# Patient Record
Sex: Female | Born: 1965 | Race: White | Hispanic: No | Marital: Married | Smoking: Never smoker
Health system: Southern US, Community
[De-identification: ages and names within clinical notes are randomized; demographics above are authoritative.]

---

## 2017-04-20 ENCOUNTER — Other Ambulatory Visit (HOSPITAL_BASED_OUTPATIENT_CLINIC_OR_DEPARTMENT_OTHER): Payer: Self-pay | Admitting: Physician Assistant

## 2017-04-20 DIAGNOSIS — Z1231 Encounter for screening mammogram for malignant neoplasm of breast: Secondary | ICD-10-CM

## 2017-06-22 ENCOUNTER — Other Ambulatory Visit (HOSPITAL_BASED_OUTPATIENT_CLINIC_OR_DEPARTMENT_OTHER): Payer: Self-pay | Admitting: Physician Assistant

## 2017-06-22 ENCOUNTER — Encounter (HOSPITAL_BASED_OUTPATIENT_CLINIC_OR_DEPARTMENT_OTHER): Payer: Self-pay

## 2017-06-22 ENCOUNTER — Ambulatory Visit (HOSPITAL_BASED_OUTPATIENT_CLINIC_OR_DEPARTMENT_OTHER)
Admission: RE | Admit: 2017-06-22 | Discharge: 2017-06-22 | Disposition: A | Discharge status: Home / Self Care | Payer: Managed Care, Other (non HMO) | Source: Ambulatory Visit | Attending: Physician Assistant | Admitting: Physician Assistant

## 2017-06-22 DIAGNOSIS — Z1231 Encounter for screening mammogram for malignant neoplasm of breast: Secondary | ICD-10-CM

## 2017-07-20 ENCOUNTER — Other Ambulatory Visit: Payer: Self-pay | Admitting: Family Medicine

## 2017-07-20 DIAGNOSIS — M25462 Effusion, left knee: Secondary | ICD-10-CM

## 2017-07-21 ENCOUNTER — Ambulatory Visit (HOSPITAL_BASED_OUTPATIENT_CLINIC_OR_DEPARTMENT_OTHER)
Admission: RE | Admit: 2017-07-21 | Discharge: 2017-07-21 | Disposition: A | Discharge status: Home / Self Care | Payer: Managed Care, Other (non HMO) | Source: Ambulatory Visit | Attending: Family Medicine | Admitting: Family Medicine

## 2017-07-21 DIAGNOSIS — M25462 Effusion, left knee: Secondary | ICD-10-CM

## 2017-07-21 DIAGNOSIS — M7989 Other specified soft tissue disorders: Secondary | ICD-10-CM | POA: Insufficient documentation

## 2017-07-21 DIAGNOSIS — M1712 Unilateral primary osteoarthritis, left knee: Secondary | ICD-10-CM | POA: Insufficient documentation

## 2018-02-27 ENCOUNTER — Emergency Department (HOSPITAL_COMMUNITY)
Admission: EM | Admit: 2018-02-27 | Discharge: 2018-02-27 | Disposition: A | Discharge status: Home / Self Care | Payer: BC Managed Care – PPO | Attending: Emergency Medicine | Admitting: Emergency Medicine

## 2018-02-27 ENCOUNTER — Other Ambulatory Visit: Payer: Self-pay

## 2018-02-27 DIAGNOSIS — M25551 Pain in right hip: Secondary | ICD-10-CM | POA: Diagnosis not present

## 2018-02-27 MED ORDER — KETOROLAC TROMETHAMINE 60 MG/2ML IM SOLN
60.0000 mg | Freq: Once | INTRAMUSCULAR | Status: AC
  Administered 2018-02-27: 60 mg via INTRAMUSCULAR
  Filled 2018-02-27: qty 2

## 2018-02-27 MED ORDER — DICLOFENAC SODIUM 75 MG PO TBEC: 75.0000 mg | DELAYED_RELEASE_TABLET | Freq: Two times a day (BID) | ORAL | 0 refills | Status: AC

## 2018-02-27 MED ORDER — METHYLPREDNISOLONE SODIUM SUCC 125 MG IJ SOLR
125.0000 mg | Freq: Once | INTRAMUSCULAR | Status: AC
  Administered 2018-02-27: 125 mg via INTRAMUSCULAR
  Filled 2018-02-27: qty 2

## 2018-02-27 MED ORDER — HYDROCODONE-ACETAMINOPHEN 5-325 MG PO TABS: 2.0000 | ORAL_TABLET | ORAL | 0 refills | Status: AC | PRN

## 2018-02-27 MED ORDER — METHOCARBAMOL 500 MG PO TABS: 500.0000 mg | ORAL_TABLET | Freq: Two times a day (BID) | ORAL | 0 refills | Status: AC

## 2018-02-27 NOTE — ED Provider Notes (Signed)
[facility] EMERGENCY DEPARTMENT Provider Note   CSN: [id number] Arrival date & time: 02/27/18  1746     History   Chief Complaint Chief Complaint  Patient presents with  . Hip Pain    HPI Caitlin Rollins is a 51 y.o. female.  The history is provided by the patient. No language interpreter was used.  Hip Pain  This is a new problem. The problem occurs constantly. The problem has been gradually worsening. Pertinent negatives include no chest pain and no abdominal pain. Nothing aggravates the symptoms. Nothing relieves the symptoms. She has tried nothing for the symptoms. The treatment provided no relief.  Pt complains of pain in her right hip.  Pt was seen at after hours ortho clinic.  Pt was given solumedrol and torodol with some relief.  Pt reports she has not been able to use crutches due to her job.  Pt reports she is suppose to follow up with Dr. Farris Has and can not due to her job.  No past medical history on file.  There are no active problems to display for this patient.      OB History   None      Home Medications    Prior to Admission medications   Medication Sig Start Date End Date Taking? Authorizing Provider  diclofenac (VOLTAREN) 75 MG EC tablet Take 1 tablet (75 mg total) by mouth 2 (two) times daily. 02/27/18   Elson Areas, PA-C  HYDROcodone-acetaminophen (NORCO/VICODIN) 5-325 MG tablet Take 2 tablets by mouth every 4 (four) hours as needed. 02/27/18   Elson Areas, PA-C  methocarbamol (ROBAXIN) 500 MG tablet Take 1 tablet (500 mg total) by mouth 2 (two) times daily. 02/27/18   Elson Areas, PA-C    Family History No family history on file.  Social History Social History   Tobacco Use  . Smoking status: Not on file  Substance Use Topics  . Alcohol use: Not on file  . Drug use: Not on file     Allergies   Patient has no known allergies.   Review of Systems Review of Systems  Cardiovascular: Negative for chest  pain.  Gastrointestinal: Negative for abdominal pain.  All other systems reviewed and are negative.    Physical Exam Updated Vital Signs BP (!) 163/84   Pulse 70   Temp 99.3 F (37.4 C) (Oral)   Resp 16   SpO2 100%   Physical Exam  Constitutional: She appears well-developed and well-nourished.  HENT:  Head: Normocephalic.  Cardiovascular: Normal rate.  Pulmonary/Chest: Effort normal.  Musculoskeletal: She exhibits tenderness.  Tender right hip,  Pain with movement  Neurological: She is alert.  Skin: Skin is warm.  Psychiatric: She has a normal mood and affect.  Nursing note and vitals reviewed.    ED Treatments / Results  Labs (all labs ordered are listed, but only abnormal results are displayed) Labs Reviewed - No data to display  EKG None  Radiology No results found.  Procedures Procedures (including critical care time)  Medications Ordered in ED Medications  ketorolac (TORADOL) injection 60 mg (60 mg Intramuscular Given 02/27/18 1925)  methylPREDNISolone sodium succinate (SOLU-MEDROL) 125 mg/2 mL injection 125 mg (125 mg Intramuscular Given 02/27/18 1925)     Initial Impression / Assessment and Plan / ED Course  I have reviewed the triage vital signs and the nursing notes.  Pertinent labs & imaging results that were available during my care of the patient were reviewed by me  and considered in my medical decision making (see chart for details).     MDM  I can not see xray but pt was told she has arthritis. Pt has not had a fall, I doubt fracture.  Pt is requesting higher dose of prednisone because she does not think it is working.   Pt given torodol and solumedrol.  Pt advised to hold prednisone pills today.  Pt given rx for voltaren, robaxin and hydrocodone.  Pt advised to use crutches, ice/heat.  Pt is advised she will need to see Orthopaedist in follow up.  Pt advised she may need further imaging.   Final Clinical Impressions(s) / ED Diagnoses    Final diagnoses:  Right hip pain    ED Discharge Orders         Ordered    diclofenac (VOLTAREN) 75 MG EC tablet  2 times daily     02/27/18 1901    methocarbamol (ROBAXIN) 500 MG tablet  2 times daily     02/27/18 1901    HYDROcodone-acetaminophen (NORCO/VICODIN) 5-325 MG tablet  Every 4 hours PRN     02/27/18 1921        An After Visit Summary was printed and given to the patient.   Elson AreasSofia, Isadore Palecek K, PA-C 02/27/18 2222    Melene PlanFloyd, Dan, DO 03/02/18 [phone]

## 2018-02-27 NOTE — Discharge Instructions (Signed)
Use crutches for the next 4 days.  Follow up with Orthopaedist as directed.

## 2018-02-27 NOTE — ED Triage Notes (Signed)
Pt here with right sided hip pain. Pt was recently seen at orthopedist and given crutches and prednisone 10mg  with follow up to see Dr. Farris HasKramer but is unable to see him due to availability. Pt also endorses taking IB profen which helps, but she has taken so much that now her stomach is hurting some, so she has stopped taking that.

## 2018-09-26 ENCOUNTER — Other Ambulatory Visit: Payer: Self-pay | Admitting: Nurse Practitioner

## 2018-09-26 DIAGNOSIS — N6320 Unspecified lump in the left breast, unspecified quadrant: Secondary | ICD-10-CM

## 2018-09-26 DIAGNOSIS — N6310 Unspecified lump in the right breast, unspecified quadrant: Secondary | ICD-10-CM

## 2018-10-09 ENCOUNTER — Ambulatory Visit
Admission: RE | Admit: 2018-10-09 | Discharge: 2018-10-09 | Disposition: A | Discharge status: Home / Self Care | Payer: BC Managed Care – PPO | Source: Ambulatory Visit | Attending: Nurse Practitioner | Admitting: Nurse Practitioner

## 2018-10-09 ENCOUNTER — Ambulatory Visit: Payer: BLUE CROSS/BLUE SHIELD

## 2018-10-09 ENCOUNTER — Ambulatory Visit
Admission: RE | Admit: 2018-10-09 | Discharge: 2018-10-09 | Disposition: A | Discharge status: Home / Self Care | Payer: BLUE CROSS/BLUE SHIELD | Source: Ambulatory Visit | Attending: Nurse Practitioner | Admitting: Nurse Practitioner

## 2018-10-09 ENCOUNTER — Other Ambulatory Visit: Payer: Self-pay

## 2018-10-09 DIAGNOSIS — N6310 Unspecified lump in the right breast, unspecified quadrant: Secondary | ICD-10-CM

## 2018-10-09 DIAGNOSIS — N6320 Unspecified lump in the left breast, unspecified quadrant: Secondary | ICD-10-CM

## 2018-11-28 ENCOUNTER — Emergency Department (HOSPITAL_COMMUNITY)
Admission: EM | Admit: 2018-11-28 | Discharge: 2018-11-28 | Disposition: A | Discharge status: Home / Self Care | Payer: BC Managed Care – PPO | Attending: Emergency Medicine | Admitting: Emergency Medicine

## 2018-11-28 ENCOUNTER — Other Ambulatory Visit: Payer: Self-pay

## 2018-11-28 DIAGNOSIS — Z5321 Procedure and treatment not carried out due to patient leaving prior to being seen by health care provider: Secondary | ICD-10-CM | POA: Insufficient documentation

## 2018-11-28 DIAGNOSIS — R202 Paresthesia of skin: Secondary | ICD-10-CM | POA: Insufficient documentation

## 2018-11-28 LAB — BASIC METABOLIC PANEL
Anion gap: 12 (ref 5–15)
BUN: 13 mg/dL (ref 6–20)
CO2: 23 mmol/L (ref 22–32)
Calcium: 9.6 mg/dL (ref 8.9–10.3)
Chloride: 104 mmol/L (ref 98–111)
Creatinine, Ser: 1.14 mg/dL — ABNORMAL HIGH (ref 0.44–1.00)
GFR calc Af Amer: >60 mL/min (ref >60)
GFR calc non Af Amer: 54 mL/min — ABNORMAL LOW (ref >60)
Glucose, Bld: 99 mg/dL (ref 70–99)
Potassium: 3.8 mmol/L (ref 3.5–5.1)
Sodium: 139 mmol/L (ref 135–145)

## 2018-11-28 LAB — MAGNESIUM: Magnesium: 2.1 mg/dL (ref 1.7–2.4)

## 2018-11-28 LAB — CBC
HCT: 42.2 % (ref 36.0–46.0)
Hemoglobin: 13.1 g/dL (ref 12.0–15.0)
MCH: 28.1 pg (ref 26.0–34.0)
MCHC: 31 g/dL (ref 30.0–36.0)
MCV: 90.4 fL (ref 80.0–100.0)
Platelets: 284 10*3/uL (ref 150–400)
RBC: 4.67 MIL/uL (ref 3.87–5.11)
RDW: 13.8 % (ref 11.5–15.5)
WBC: 7.9 10*3/uL (ref 4.0–10.5)
nRBC: 0 % (ref 0.0–0.2)

## 2018-11-28 NOTE — ED Notes (Signed)
Pt states that she is leaving due to wait times, pt upset that her doctor sent her here and she is still waiting

## 2018-11-28 NOTE — ED Triage Notes (Signed)
Patient reports bilateral tingling sensation that moves from her neck to face - lips, nose, eyes, and sometimes down her arms without a notable pattern x2 days after playing tennis. She states she felt like she overdid it, got hot and had a headache for 6 hours Tuesday that she has not had since. She is in a weight loss program and on two medications for it. Denies chest pain or pressure, dizziness, weakness, just reports that she "feels off." Neuro intact.

## 2018-12-04 ENCOUNTER — Encounter: Payer: Self-pay | Admitting: *Deleted

## 2019-01-08 ENCOUNTER — Telehealth (INDEPENDENT_AMBULATORY_CARE_PROVIDER_SITE_OTHER): Payer: BC Managed Care – PPO

## 2019-01-08 DIAGNOSIS — Z01419 Encounter for gynecological examination (general) (routine) without abnormal findings: Secondary | ICD-10-CM

## 2019-01-08 NOTE — Telephone Encounter (Signed)
Message sent to front office to reschedule pt for new gyn appt.

## 2019-01-08 NOTE — Telephone Encounter (Signed)
Pt left message on nurse VM stating she had missed a call from our office about an upcoming appointment.   Called pt to notify her of new gyn appt tomorrow, 01/07/19. Pt states she is having surgery tomorrow at 11AM and will not be able to come into the office. Pt notified that I will let the front office know to call and reschedule her appt. Pt verbalizes understanding.

## 2019-01-09 ENCOUNTER — Encounter: Payer: BC Managed Care – PPO | Admitting: Obstetrics and Gynecology

## 2019-01-30 ENCOUNTER — Encounter: Payer: BC Managed Care – PPO | Admitting: Obstetrics & Gynecology

## 2019-02-03 ENCOUNTER — Encounter: Payer: Self-pay | Admitting: Student

## 2019-02-03 ENCOUNTER — Ambulatory Visit: Payer: BLUE CROSS/BLUE SHIELD | Admitting: Student

## 2019-02-03 ENCOUNTER — Other Ambulatory Visit: Payer: Self-pay

## 2019-02-03 VITALS — BP 142/94 | HR 73 | Wt 204.0 lb

## 2019-02-03 DIAGNOSIS — Z1272 Encounter for screening for malignant neoplasm of vagina: Secondary | ICD-10-CM | POA: Diagnosis not present

## 2019-02-03 DIAGNOSIS — Z124 Encounter for screening for malignant neoplasm of cervix: Secondary | ICD-10-CM

## 2019-02-03 DIAGNOSIS — Z01419 Encounter for gynecological examination (general) (routine) without abnormal findings: Secondary | ICD-10-CM | POA: Diagnosis not present

## 2019-02-03 DIAGNOSIS — Z1151 Encounter for screening for human papillomavirus (HPV): Secondary | ICD-10-CM

## 2019-02-03 DIAGNOSIS — Z113 Encounter for screening for infections with a predominantly sexual mode of transmission: Secondary | ICD-10-CM

## 2019-02-03 NOTE — Progress Notes (Signed)
Subjective:     Caitlin Rollins is a 53 y.o. female here for a routine exam.  Current complaints: no gyn complaints. Currently goes to [facility] for PCP and was referred here for completion of her pap smear.    Gynecologic History No LMP recorded. (Menstrual status: Perimenopausal). Contraception: none Last Pap: 2017. Results were: normal per patient, done in [place] Last mammogram: 10/2018. Results were: had an axillary lipoma removed last month, otherwise normal.   Obstetric History OB History  Gravida Para Term Preterm AB Living  2 2 2     2   SAB TAB Ectopic Multiple Live Births          2    # Outcome Date GA Lbr Len/2nd Weight Sex Delivery Anes PTL Lv  2 Term           1 Term              The following portions of the patient's history were reviewed and updated as appropriate: allergies, current medications, past family history, past medical history, past social history, past surgical history and problem list.  Review of Systems Pertinent items are noted in HPI.    Objective:    BP (!) 142/94   Pulse 73   Wt 204 lb (92.5 kg)   General Appearance:    Alert, cooperative, no distress, appears stated age  Head:    Normocephalic, without obvious abnormality, atraumatic  Eyes:    PERRL, conjunctiva/corneas clear, EOM's intact, fundi    benign, both eyes  Lungs:     respirations unlabored  Abdomen:     Soft, non-tender, bowel sounds active all four quadrants,    no masses, no organomegaly  Genitalia:    Normal female without lesion, discharge or tenderness. Pap smear obtained.   Extremities:   Extremities normal, atraumatic, no cyanosis or edema  Skin:   Skin color, texture, turgor normal, no rashes or lesions      Assessment:    Healthy female exam.    Plan:   1. Encounter for Papanicolaou smear of vagina as part of routine gynecological examination -Discussed weight management options as patient has recently discontinued phentermine & vyvanse. She stays  active with walking & tennis. Discussed continued activity level and consider dietary options such as weight watchers -Patient unhappy with current PCP. Encouraged to discuss with friends/neighbors/coworkers for PCP recommendations in the area.  -Had diagnostic mammogram this summer then procedure performed by [facility]. Will f/u for screening mammogram next summer. - Cytology - PAP( [place])   Follow up with our practice in 1 year for routine female health maintenance or as needed.   Jorje Guild, NP

## 2019-02-03 NOTE — Patient Instructions (Signed)
Health Maintenance, Female Adopting a healthy lifestyle and getting preventive care are important in promoting health and wellness. Ask your health care provider about:  The right schedule for you to have regular tests and exams.  Things you can do on your own to prevent diseases and keep yourself healthy. What should I know about diet, weight, and exercise? Eat a healthy diet   Eat a diet that includes plenty of vegetables, fruits, low-fat dairy products, and lean protein.  Do not eat a lot of foods that are high in solid fats, added sugars, or sodium. Maintain a healthy weight Body mass index (BMI) is used to identify weight problems. It estimates body fat based on height and weight. Your health care provider can help determine your BMI and help you achieve or maintain a healthy weight. Get regular exercise Get regular exercise. This is one of the most important things you can do for your health. Most adults should:  Exercise for at least 150 minutes each week. The exercise should increase your heart rate and make you sweat (moderate-intensity exercise).  Do strengthening exercises at least twice a week. This is in addition to the moderate-intensity exercise.  Spend less time sitting. Even light physical activity can be beneficial. Watch cholesterol and blood lipids Have your blood tested for lipids and cholesterol at 54 years of age, then have this test every 5 years. Have your cholesterol levels checked more often if:  Your lipid or cholesterol levels are high.  You are older than 54 years of age.  You are at high risk for heart disease. What should I know about cancer screening? Depending on your health history and family history, you may need to have cancer screening at various ages. This may include screening for:  Breast cancer.  Cervical cancer.  Colorectal cancer.  Skin cancer.  Lung cancer. What should I know about heart disease, diabetes, and high blood  pressure? Blood pressure and heart disease  High blood pressure causes heart disease and increases the risk of stroke. This is more likely to develop in people who have high blood pressure readings, are of African descent, or are overweight.  Have your blood pressure checked: ? Every 3-5 years if you are 18-39 years of age. ? Every year if you are 40 years old or older. Diabetes Have regular diabetes screenings. This checks your fasting blood sugar level. Have the screening done:  Once every three years after age 40 if you are at a normal weight and have a low risk for diabetes.  More often and at a younger age if you are overweight or have a high risk for diabetes. What should I know about preventing infection? Hepatitis B If you have a higher risk for hepatitis B, you should be screened for this virus. Talk with your health care provider to find out if you are at risk for hepatitis B infection. Hepatitis C Testing is recommended for:  Everyone born from 1945 through 1965.  Anyone with known risk factors for hepatitis C. Sexually transmitted infections (STIs)  Get screened for STIs, including gonorrhea and chlamydia, if: ? You are sexually active and are younger than 54 years of age. ? You are older than 54 years of age and your health care provider tells you that you are at risk for this type of infection. ? Your sexual activity has changed since you were last screened, and you are at increased risk for chlamydia or gonorrhea. Ask your health care provider if   you are at risk.  Ask your health care provider about whether you are at high risk for HIV. Your health care provider may recommend a prescription medicine to help prevent HIV infection. If you choose to take medicine to prevent HIV, you should first get tested for HIV. You should then be tested every 3 months for as long as you are taking the medicine. Pregnancy  If you are about to stop having your period (premenopausal) and  you may become pregnant, seek counseling before you get pregnant.  Take 400 to 800 micrograms (mcg) of folic acid every day if you become pregnant.  Ask for birth control (contraception) if you want to prevent pregnancy. Osteoporosis and menopause Osteoporosis is a disease in which the bones lose minerals and strength with aging. This can result in bone fractures. If you are 65 years old or older, or if you are at risk for osteoporosis and fractures, ask your health care provider if you should:  Be screened for bone loss.  Take a calcium or vitamin D supplement to lower your risk of fractures.  Be given hormone replacement therapy (HRT) to treat symptoms of menopause. Follow these instructions at home: Lifestyle  Do not use any products that contain nicotine or tobacco, such as cigarettes, e-cigarettes, and chewing tobacco. If you need help quitting, ask your health care provider.  Do not use street drugs.  Do not share needles.  Ask your health care provider for help if you need support or information about quitting drugs. Alcohol use  Do not drink alcohol if: ? Your health care provider tells you not to drink. ? You are pregnant, may be pregnant, or are planning to become pregnant.  If you drink alcohol: ? Limit how much you use to 0-1 drink a day. ? Limit intake if you are breastfeeding.  Be aware of how much alcohol is in your drink. In the [place] one drink equals one 12 oz bottle of beer (355 mL), one 5 oz glass of wine (148 mL), or one 1 oz glass of hard liquor (44 mL). General instructions  Schedule regular health, dental, and eye exams.  Stay current with your vaccines.  Tell your health care provider if: ? You often feel depressed. ? You have ever been abused or do not feel safe at home. Summary  Adopting a healthy lifestyle and getting preventive care are important in promoting health and wellness.  Follow your health care provider's instructions about healthy  diet, exercising, and getting tested or screened for diseases.  Follow your health care provider's instructions on monitoring your cholesterol and blood pressure. This information is not intended to replace advice given to you by your health care provider. Make sure you discuss any questions you have with your health care provider. Document Released: 10/03/2010 Document Revised: 03/13/2018 Document Reviewed: 03/13/2018 Elsevier Patient Education  2020 [employer]  

## 2019-02-07 LAB — CYTOLOGY - PAP
Chlamydia: NEGATIVE
Comment: NEGATIVE
Comment: NEGATIVE
Comment: NORMAL
Diagnosis: NEGATIVE
High risk HPV: NEGATIVE
Neisseria Gonorrhea: NEGATIVE

## 2020-03-05 ENCOUNTER — Other Ambulatory Visit: Payer: Self-pay | Admitting: Nurse Practitioner

## 2020-03-05 DIAGNOSIS — Z1231 Encounter for screening mammogram for malignant neoplasm of breast: Secondary | ICD-10-CM

## 2020-08-10 ENCOUNTER — Ambulatory Visit
Admission: RE | Admit: 2020-08-10 | Discharge: 2020-08-10 | Disposition: A | Discharge status: Home / Self Care | Payer: BC Managed Care – PPO | Source: Ambulatory Visit | Attending: Nurse Practitioner | Admitting: Nurse Practitioner

## 2020-08-10 ENCOUNTER — Other Ambulatory Visit: Payer: Self-pay

## 2020-08-10 DIAGNOSIS — Z1231 Encounter for screening mammogram for malignant neoplasm of breast: Secondary | ICD-10-CM

## 2022-03-20 IMAGING — MG MM DIGITAL SCREENING BILAT W/ TOMO AND CAD
8 series · 8 of 24 positions shown · non-contrast
Comparison: Previous exam(s).

CLINICAL DATA: Screening.

EXAM:
DIGITAL SCREENING BILATERAL MAMMOGRAM WITH TOMOSYNTHESIS AND CAD
TECHNIQUE: Bilateral screening digital craniocaudal and mediolateral oblique
mammograms were obtained. Bilateral screening digital breast
tomosynthesis was performed. The images were evaluated with
computer-aided detection.

[L CC synth-2D]
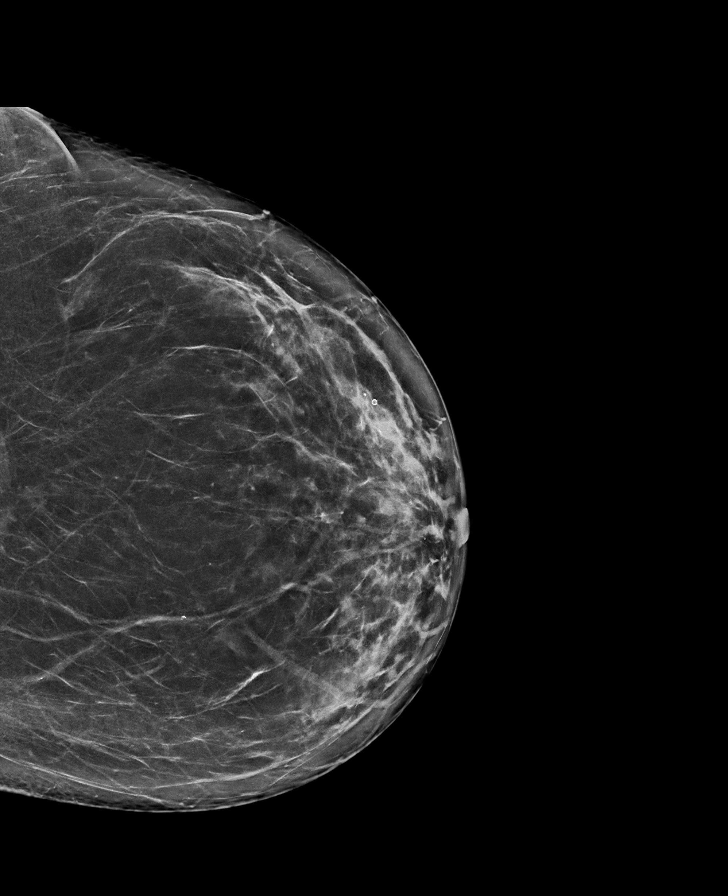

[L MLO synth-2D]
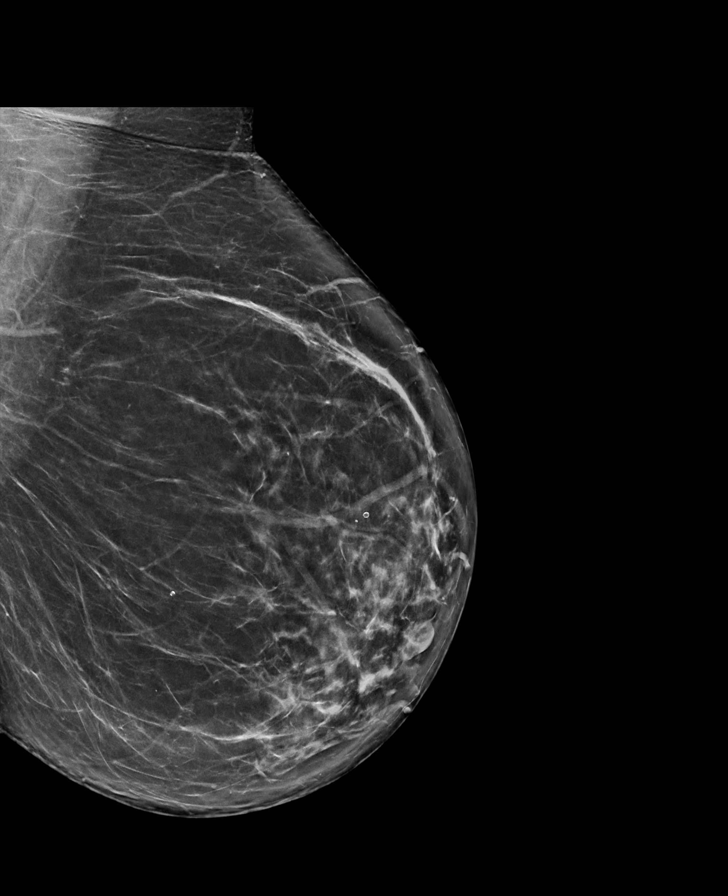

[R MLO synth-2D]
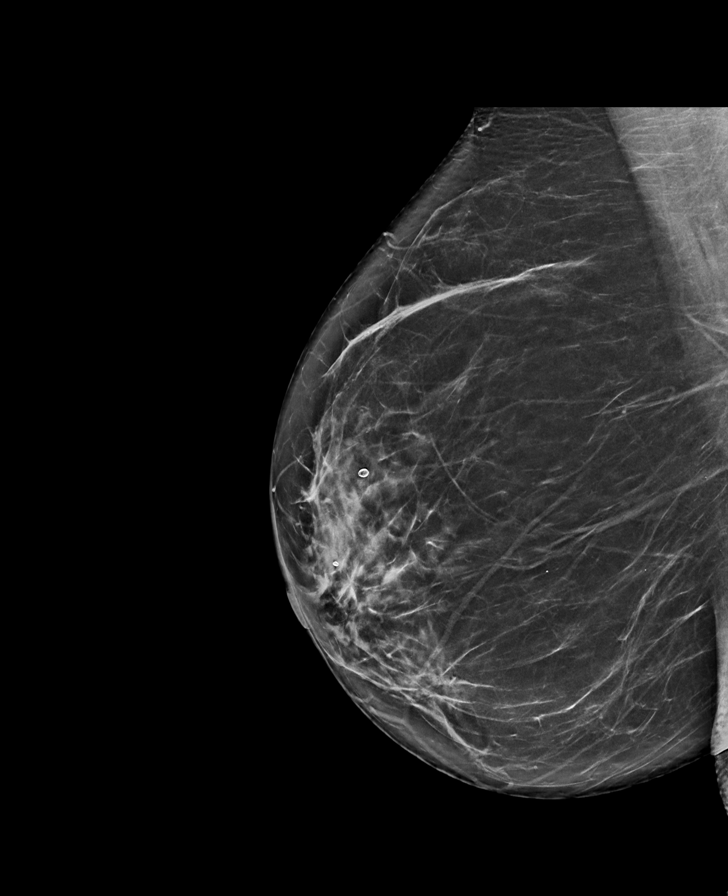

[R CC synth-2D]
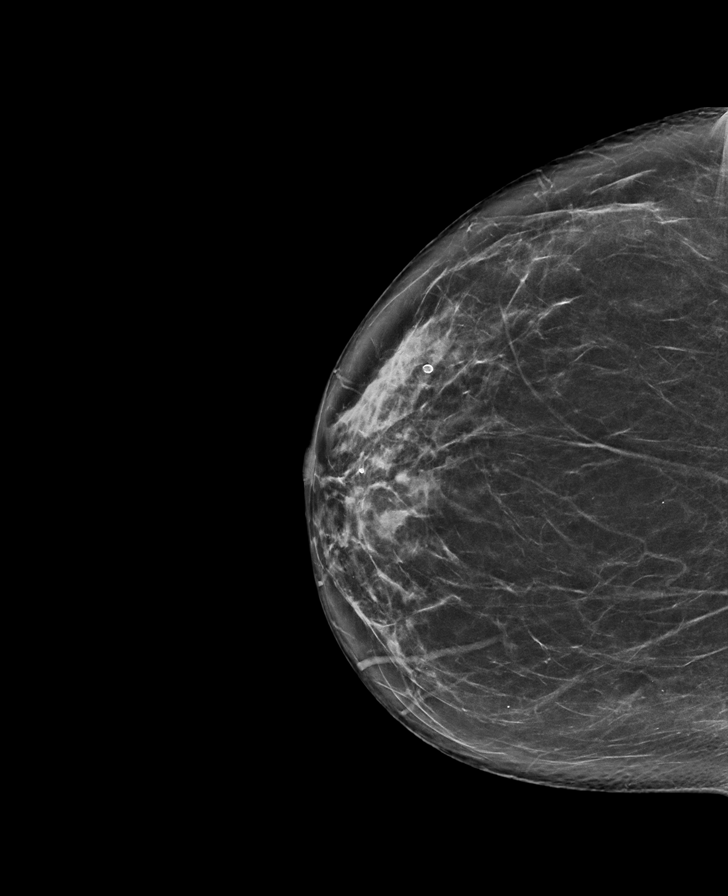

[R CC tomo · tomo slice 41/82.0]
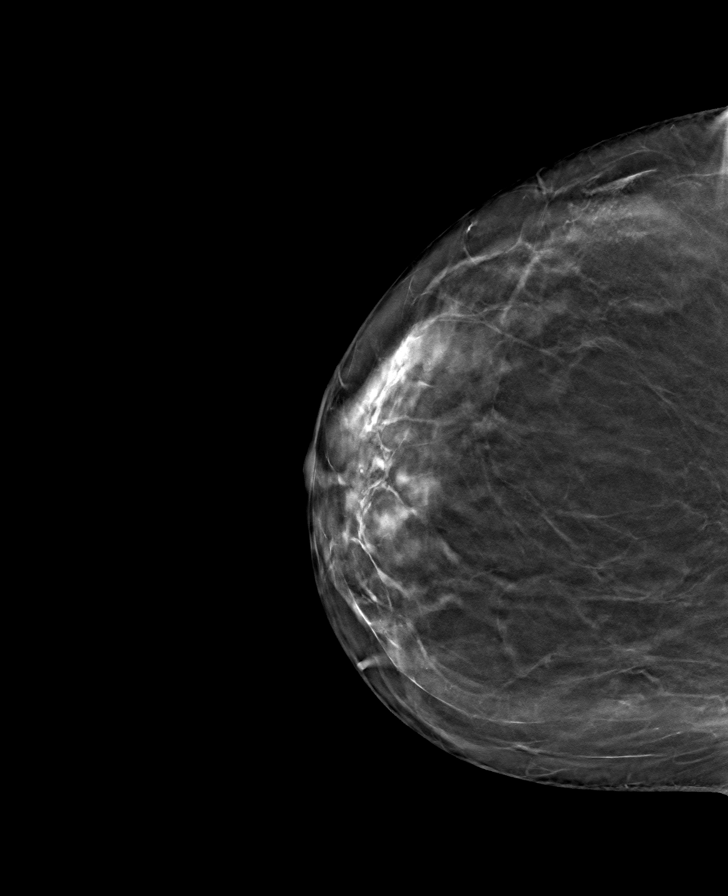

[R MLO tomo · tomo slice 43/84.0]
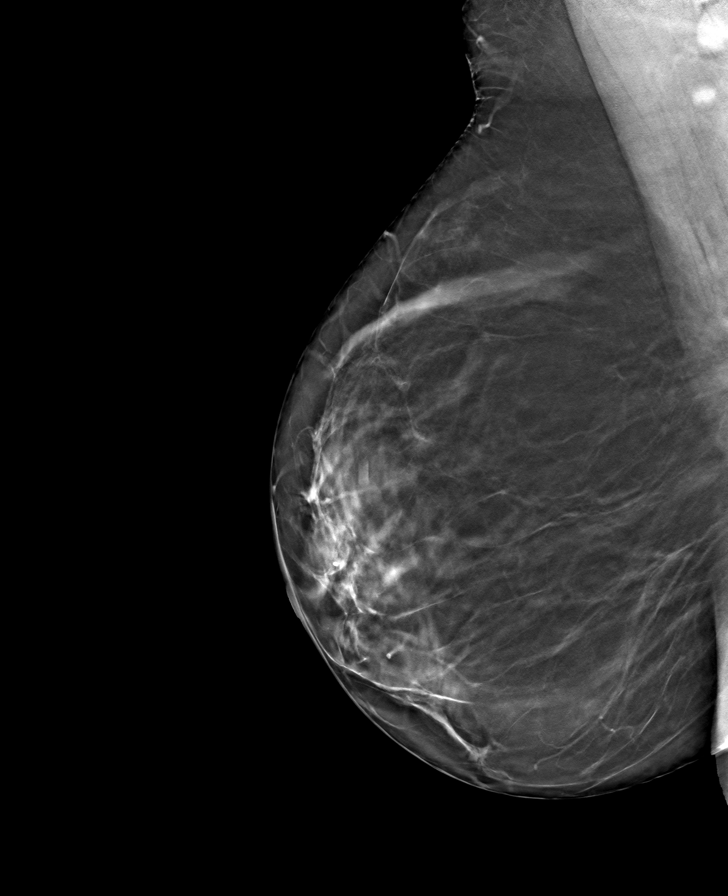

[L CC tomo · tomo slice 43/84.0]
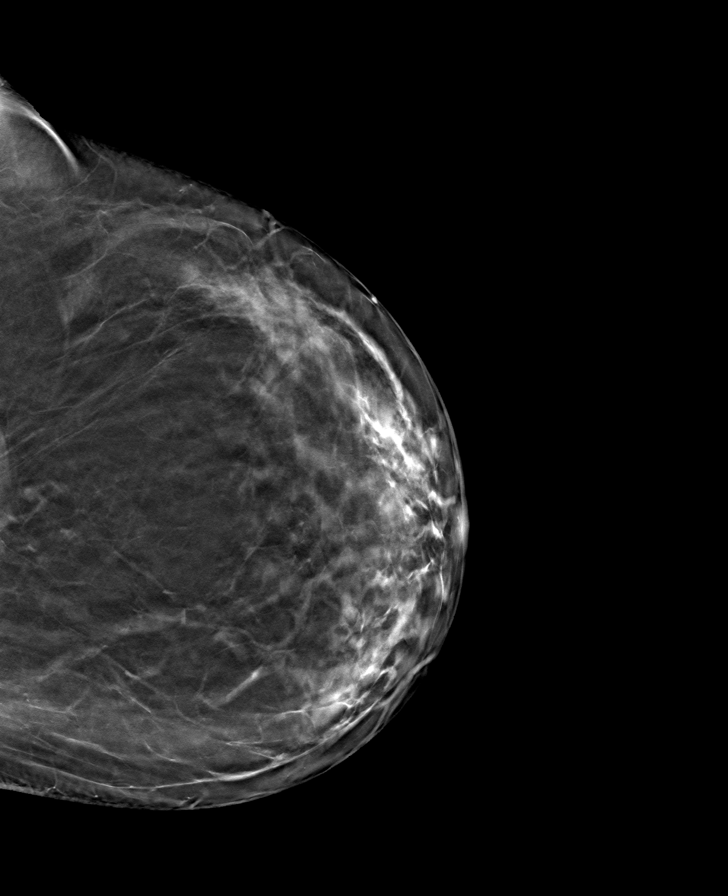

[L MLO tomo · tomo slice 43/85.0]
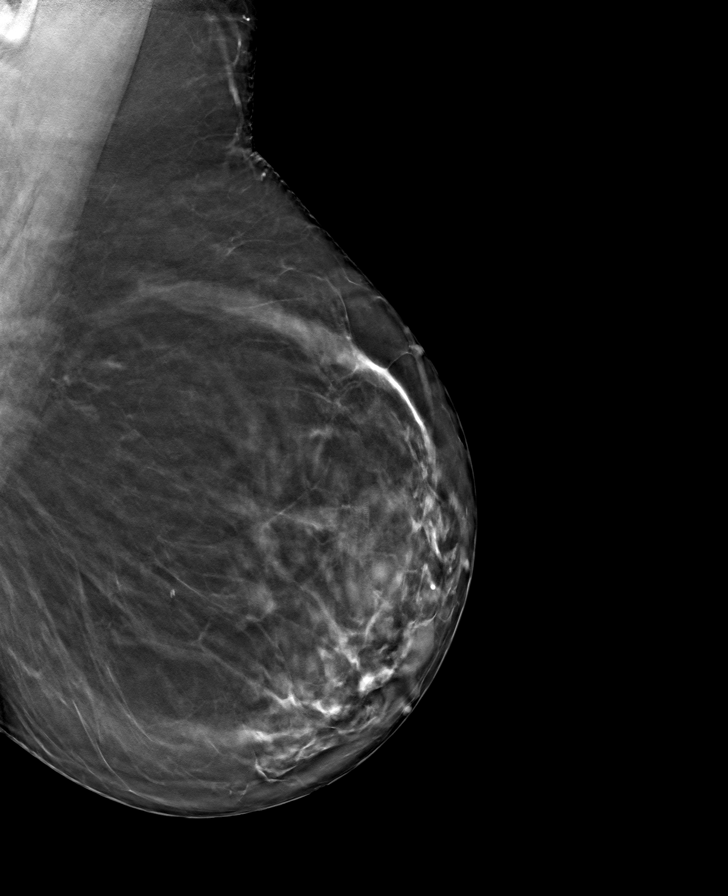

[8 of 24 positions shown; findings below may reference images not displayed]

ACR Breast Density Category b: There are scattered areas of
fibroglandular density.
FINDINGS: There are no findings suspicious for malignancy. The images were
evaluated with computer-aided detection.
IMPRESSION: No mammographic evidence of malignancy. A result letter of this
screening mammogram will be mailed directly to the patient.

RECOMMENDATION:
Screening mammogram in one year. (Code:[id number])

BI-RADS CATEGORY  1: Negative.
